# Patient Record
Sex: Male | Born: 1995 | Race: White | Hispanic: No | Marital: Single | Smoking: Never smoker
Health system: Southern US, Community
[De-identification: ages and names within clinical notes are randomized; demographics above are authoritative.]

## PROBLEM LIST (undated history)

## (undated) HISTORY — PX: FOOT SURGERY: SHX648

---
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
## 2020-04-10 ENCOUNTER — Emergency Department: Payer: PRIVATE HEALTH INSURANCE

## 2020-04-10 ENCOUNTER — Emergency Department
Admission: EM | Admit: 2020-04-10 | Discharge: 2020-04-10 | Disposition: A | Discharge status: Home / Self Care | Payer: PRIVATE HEALTH INSURANCE | Attending: Emergency Medicine | Admitting: Emergency Medicine

## 2020-04-10 DIAGNOSIS — Y908 Blood alcohol level of 240 mg/100 ml or more: Secondary | ICD-10-CM

## 2020-04-10 DIAGNOSIS — Z20822 Contact with and (suspected) exposure to covid-19: Secondary | ICD-10-CM | POA: Insufficient documentation

## 2020-04-10 DIAGNOSIS — F10129 Alcohol abuse with intoxication, unspecified: Secondary | ICD-10-CM | POA: Insufficient documentation

## 2020-04-10 DIAGNOSIS — T68XXXA Hypothermia, initial encounter: Secondary | ICD-10-CM

## 2020-04-10 DIAGNOSIS — R680 Hypothermia, not associated with low environmental temperature: Secondary | ICD-10-CM | POA: Insufficient documentation

## 2020-04-10 DIAGNOSIS — F10920 Alcohol use, unspecified with intoxication, uncomplicated: Secondary | ICD-10-CM

## 2020-04-10 LAB — CBC WITH DIFFERENTIAL/PLATELET
Abs Immature Granulocytes: 0.02 10*3/uL (ref 0.00–0.07)
Basophils Absolute: 0 10*3/uL (ref 0.0–0.1)
Basophils Relative: 0 %
Eosinophils Absolute: 0 10*3/uL (ref 0.0–0.5)
Eosinophils Relative: 0 %
HCT: 45.9 % (ref 39.0–52.0)
Hemoglobin: 15.7 g/dL (ref 13.0–17.0)
Immature Granulocytes: 0 %
Lymphocytes Relative: 29 %
Lymphs Abs: 2.3 10*3/uL (ref 0.7–4.0)
MCH: 31.2 pg (ref 26.0–34.0)
MCHC: 34.2 g/dL (ref 30.0–36.0)
MCV: 91.3 fL (ref 80.0–100.0)
Monocytes Absolute: 0.4 10*3/uL (ref 0.1–1.0)
Monocytes Relative: 5 %
Neutro Abs: 5 10*3/uL (ref 1.7–7.7)
Neutrophils Relative %: 66 %
Platelets: 315 10*3/uL (ref 150–400)
RBC: 5.03 MIL/uL (ref 4.22–5.81)
RDW: 12 % (ref 11.5–15.5)
WBC: 7.7 10*3/uL (ref 4.0–10.5)
nRBC: 0 % (ref 0.0–0.2)

## 2020-04-10 LAB — URINE DRUG SCREEN, QUALITATIVE (ARMC ONLY)
Amphetamines, Ur Screen: NOT DETECTED
Barbiturates, Ur Screen: NOT DETECTED
Benzodiazepine, Ur Scrn: NOT DETECTED
Cannabinoid 50 Ng, Ur ~~LOC~~: NOT DETECTED
Cocaine Metabolite,Ur ~~LOC~~: NOT DETECTED
MDMA (Ecstasy)Ur Screen: NOT DETECTED
Methadone Scn, Ur: NOT DETECTED
Opiate, Ur Screen: NOT DETECTED
Phencyclidine (PCP) Ur S: NOT DETECTED
Tricyclic, Ur Screen: NOT DETECTED

## 2020-04-10 LAB — COMPREHENSIVE METABOLIC PANEL
ALT: 20 U/L (ref 0–44)
AST: 29 U/L (ref 15–41)
Albumin: 4.9 g/dL (ref 3.5–5.0)
Alkaline Phosphatase: 57 U/L (ref 38–126)
Anion gap: 14 (ref 5–15)
BUN: 12 mg/dL (ref 6–20)
CO2: 24 mmol/L (ref 22–32)
Calcium: 8.9 mg/dL (ref 8.9–10.3)
Chloride: 103 mmol/L (ref 98–111)
Creatinine, Ser: 1.06 mg/dL (ref 0.61–1.24)
GFR, Estimated: >60 mL/min (ref >60)
Glucose, Bld: 175 mg/dL — ABNORMAL HIGH (ref 70–99)
Potassium: 3.4 mmol/L — ABNORMAL LOW (ref 3.5–5.1)
Sodium: 141 mmol/L (ref 135–145)
Total Bilirubin: 0.7 mg/dL (ref 0.3–1.2)
Total Protein: 8.2 g/dL — ABNORMAL HIGH (ref 6.5–8.1)

## 2020-04-10 LAB — RESP PANEL BY RT-PCR (FLU A&B, COVID) ARPGX2
Influenza A by PCR: NEGATIVE
Influenza B by PCR: NEGATIVE
SARS Coronavirus 2 by RT PCR: NEGATIVE

## 2020-04-10 LAB — ETHANOL: Alcohol, Ethyl (B): 271 mg/dL — ABNORMAL HIGH (ref <10)

## 2020-04-10 LAB — TROPONIN I (HIGH SENSITIVITY): Troponin I (High Sensitivity): 3 ng/L (ref <18)

## 2020-04-10 LAB — CK: Total CK: 202 U/L (ref 49–397)

## 2020-04-10 MED ORDER — DEXTROSE IN LACTATED RINGERS 5 % IV SOLN: INTRAVENOUS | Status: DC

## 2020-04-10 MED ORDER — SODIUM CHLORIDE 0.9 % IV BOLUS
1000.0000 mL | Freq: Once | INTRAVENOUS | Status: AC
  Administered 2020-04-10: 1000 mL via INTRAVENOUS

## 2020-04-10 MED ORDER — ONDANSETRON HCL 4 MG/2ML IJ SOLN
4.0000 mg | Freq: Once | INTRAMUSCULAR | Status: AC
  Administered 2020-04-10: 4 mg via INTRAVENOUS
  Filled 2020-04-10: qty 2

## 2020-04-10 NOTE — ED Notes (Signed)
Friends able to [employer] on story. Pt has been drinking tonight. Unknown amount but found foaming at the mouth by friends.

## 2020-04-10 NOTE — Discharge Instructions (Signed)
Drink alcohol only in moderation.  Return to the ER for worsening symptoms, persistent vomiting, lethargy, difficulty breathing or other concerns.

## 2020-04-10 NOTE — ED Triage Notes (Signed)
Pt to ED unresponsive after friends reportedly found pt face down in the back of a truck. It is unknown if pt ingested anything. Pt is cool and diaphoretic with a clenched jaw upon arrival to treatment room. Emesis on pt. No loss of bowels noted.

## 2020-04-10 NOTE — ED Notes (Signed)
Pt placed on 2L of O2 via [place] by Dr. Dolores Frame. Pt resting in bed at this time. Oral suction needed intermittently due to vomiting but pt able to spit to clear airway. Head of bed elevated.

## 2020-04-10 NOTE — ED Provider Notes (Signed)
I assumed care of this patient approximately 0 700.  Please see outgoing providers note for full details regarding patient's initial evaluation assessment.  In brief patient presented to the ED after being driven by friends found him unresponsive and he was delivered to the ED unresponsive facedown in the back of a truck.  Initial evaluation is concerning for significant EtOH intoxication complicated by hypothermia.  Initial ED work-up: CT Head Unremarkable CXR Unremarkable  CBC unremarkable.  CMP remarkable for K3.4, glucose of 175 and no significant electrolyte or metabolic derangements.  No evidence of acidosis.  Serum ethanol is 271.  CK is 202.  Troponin III.Marland Kitchen UDS negative.  Covid screen negative.  Medications  sodium chloride 0.9 % bolus 1,000 mL (0 mLs Intravenous Stopped 04/10/20 0623)  ondansetron (ZOFRAN) injection 4 mg (4 mg Intravenous Given 04/10/20 0306)  sodium chloride 0.9 % bolus 1,000 mL (0 mLs Intravenous Stopped 04/10/20 0623)  sodium chloride 0.9 % bolus 1,000 mL (1,000 mLs Intravenous New Bag/Given 04/10/20 9323)   Patient initially acquired by hugger for temperature of 93.  Plan is to labs until patient is clinically sober.  After patient reached normothermia of her hyper was move patient subsequently to normothermia without backup.  On several reassessments he had improving mentation and on my last reassessment he was awake alert and not slurring his speech and appeared clinically sober.  He was able to ambulate with steady gait unassisted.  Patient counseled on dangers of binge drinking and alcohol abuse.  Discharged stable condition.  Strict return precautions advised discussed    Gilles Chiquito, MD 04/10/20 [id number]

## 2020-04-10 NOTE — ED Notes (Signed)
X-ray at bedside

## 2020-04-10 NOTE — ED Provider Notes (Signed)
[facility] Emergency Department Provider Note   ____________________________________________   First MD Initiated Contact with Patient 04/10/20 0240     (approximate)  I have reviewed the triage vital signs and the nursing notes.   HISTORY  Chief Complaint Unresponsive state   HPI Darren Roberts is a 24 y.o. male dropped off by friends after being found unresponsive laying face down in the back of the truck.  Heavy EtOH suspected.  Unknown if patient ingested other substances.  Presents to the ED unresponsive, cool to the touch with clenched jaw.  Rest of history currently unobtainable.     Past medical history None  There are no problems to display for this patient.   Past Surgical History:  Procedure Laterality Date  . FOOT SURGERY      Prior to Admission medications   Medication Sig Start Date End Date Taking? Authorizing Provider  HYDROcodone-acetaminophen (NORCO) 5-325 MG per tablet Take 1 tablet by mouth every 6 (six) hours as needed. 01/31/15   Janne Napoleon, NP    Allergies Patient has no known allergies.  No family history on file.  Social History Social History   Tobacco Use  . Smoking status: Never Smoker  Substance Use Topics  . Alcohol use: No  . Drug use: No  +EtOH  Review of Systems  Constitutional: Positive for unresponsive state.  No fever/chills Eyes: No visual changes. ENT: No sore throat. Cardiovascular: Denies chest pain. Respiratory: Denies shortness of breath. Gastrointestinal: No abdominal pain.  Positive for emesis on shirt.  No diarrhea.  No constipation. Genitourinary: Negative for dysuria. Musculoskeletal: Negative for back pain. Skin: Negative for rash. Neurological: Negative for headaches, focal weakness or numbness.   ____________________________________________   PHYSICAL EXAM:  VITAL SIGNS: ED Triage Vitals  Enc Vitals Group     BP      Pulse      Resp      Temp      Temp src       SpO2      Weight      Height      Head Circumference      Peak Flow      Pain Score      Pain Loc      Pain Edu?      Excl. in GC?     Constitutional: Unresponsive, cool, clenched jaw.   Eyes: Conjunctivae are normal. PERRL. EOMI. Head: Atraumatic. Nose: Atraumatic. Mouth/Throat: Mucous membranes are moist.  Clenched jaw.   Neck: No stridor.  No cervical spine step-offs or deformities noted. Cardiovascular: Normal rate, regular rhythm. Grossly normal heart sounds.  Good peripheral circulation. Respiratory: Normal respiratory effort.  No retractions. Lungs CTAB. Gastrointestinal: Soft and nontender to light or deep palpation.  Evidence of emesis on T-shirt.  No distention. No abdominal bruits. No CVA tenderness. Musculoskeletal: No lower extremity edema.  No joint effusions.  No external evidence of injury. Neurologic: Unresponsive.  Opens eyes to sternal rub.  Follows command to open his mouth.  + Gag reflex.   Skin:  Skin is cool, dry and intact. No rash noted. Psychiatric: Unable to assess. ____________________________________________   LABS (all labs ordered are listed, but only abnormal results are displayed)  Labs Reviewed  COMPREHENSIVE METABOLIC PANEL - Abnormal; Notable for the following components:      Result Value   Potassium 3.4 (*)    Glucose, Bld 175 (*)    Total Protein 8.2 (*)  All other components within normal limits  ETHANOL - Abnormal; Notable for the following components:   Alcohol, Ethyl (B) 271 (*)    All other components within normal limits  RESP PANEL BY RT-PCR (FLU A&B, COVID) ARPGX2  CBC WITH DIFFERENTIAL/PLATELET  URINE DRUG SCREEN, QUALITATIVE ([facility] ONLY)  CK  TROPONIN I (HIGH SENSITIVITY)   ____________________________________________  EKG  ED ECG REPORT I, Ruston Fedora J, the attending physician, personally viewed and interpreted this ECG.   Date: 04/10/2020  EKG Time: 0303  Rate: 71  Rhythm: normal EKG, normal sinus rhythm  Axis:  Normal  Intervals:none  ST&T Change: Mild global ST elevation  Repeat EKG; mild ST elevation globally does not appear to be morphologic for STEMI  ED ECG REPORT I, Delfin Squillace J, the attending physician, personally viewed and interpreted this ECG.   Date: 04/10/2020  EKG Time: 0311  Rate: 72  Rhythm: normal EKG, normal sinus rhythm  Axis: Normal  Intervals:none  ST&T Change: Nonspecific   ____________________________________________  RADIOLOGY I, Meric Joye J, personally viewed and evaluated these images (plain radiographs) as part of my medical decision making, as well as reviewing the written report by the radiologist.  ED MD interpretation: No ICH, no acute cardiopulmonary process  Official radiology report(s): CT Head Wo Contrast  Result Date: 04/10/2020 CLINICAL DATA:  Mental status change EXAM: CT HEAD WITHOUT CONTRAST TECHNIQUE: Contiguous axial images were obtained from the base of the skull through the vertex without intravenous contrast. COMPARISON:  None. FINDINGS: Brain: No evidence of acute territorial infarction, hemorrhage, hydrocephalus,extra-axial collection or mass lesion/mass effect. Normal gray-white differentiation. Ventricles are normal in size and contour. Vascular: No hyperdense vessel or unexpected calcification. Skull: The skull is intact. No fracture or focal lesion identified. Sinuses/Orbits: The visualized paranasal sinuses and mastoid air cells are clear. The orbits and globes intact. Other: None IMPRESSION: No acute intracranial abnormality. Electronically Signed   By: Jonna Clark M.D.   On: 04/10/2020 03:50   DG Chest Port 1 View  Result Date: 04/10/2020 CLINICAL DATA:  Unresponsive. EXAM: PORTABLE CHEST 1 VIEW COMPARISON:  None. FINDINGS: The heart size and mediastinal contours are within normal limits. Both lungs are clear. The visualized skeletal structures are unremarkable. There is an old healed right clavicle fracture. IMPRESSION: No active disease.  Electronically Signed   By: Katherine Mantle M.D.   On: 04/10/2020 03:14    ____________________________________________   PROCEDURES  Procedure(s) performed (including Critical Care):  .1-3 Lead EKG Interpretation Performed by: Irean Hong, MD Authorized by: Irean Hong, MD      CRITICAL CARE Performed by: Irean Hong   Total critical care time: 45 minutes  Critical care time was exclusive of separately billable procedures and treating other patients.  Critical care was necessary to treat or prevent imminent or life-threatening deterioration.  Critical care was time spent personally by me on the following activities: development of treatment plan with patient and/or surrogate as well as nursing, discussions with consultants, evaluation of patient's response to treatment, examination of patient, obtaining history from patient or surrogate, ordering and performing treatments and interventions, ordering and review of laboratory studies, ordering and review of radiographic studies, pulse oximetry and re-evaluation of patient's condition.  ____________________________________________   INITIAL IMPRESSION / ASSESSMENT AND PLAN / ED COURSE  As part of my medical decision making, I reviewed the following data within the electronic MEDICAL RECORD NUMBER Nursing notes reviewed and incorporated, Labs reviewed, EKG interpreted, Old chart reviewed (pediatric visits), Radiograph reviewed and Notes  from prior ED visits (clavicle fracture 8833)     24 year old male presenting with unresponsive state after a night of heavy drinking of liquor. Differential diagnosis includes, but is not limited to, alcohol, illicit or prescription medications, or other toxic ingestion; intracranial pathology such as stroke or intracerebral hemorrhage; fever or infectious causes including sepsis; hypoxemia and/or hypercarbia; uremia; trauma; endocrine related disorders such as diabetes, hypoglycemia, and  thyroid-related diseases; hypertensive encephalopathy; etc.  Will obtain toxicological work-up, chest x-ray as patient is at great risk for aspiration.  Obtain CT head as there is unknown history of trauma because history is limited at this time.  Will obtain collateral information from patient's friends.  Initiate IV fluid resuscitation, IV Zofran for emesis.  Will reassess.   Clinical Course as of Apr 10 699  Wynelle Link Apr 10, 2020  5916 Updated patient's father who is at bedside.  States patient is normally not a drinker.  Was with him until 6 PM; suspects he drank a large amount of liquor in a short amount of time.  Patient sleeping soundly, IV fluids infusing.  We will continue to monitor in the emergency department until sobriety.   [JS]  0610 Patient remains asleep under [place] hugger with father at bedside.  Will administer third liter IV fluids   [JS]  0659 Patient sleeping in no acute distress.  Father remains at bedside.  Core temperature now up to 98.2 F.  Anticipate patient may be discharged home once he is sober and ambulatory. Care transferred at change of shift to Dr. Katrinka Blazing pending reassessment.   [JS]    Clinical Course User Index [JS] Irean Hong, MD     ____________________________________________   FINAL CLINICAL IMPRESSION(S) / ED DIAGNOSES  Final diagnoses:  Alcoholic intoxication without complication (HCC)  Hypothermia, initial encounter     ED Discharge Orders    None      *Please note:  Darren Roberts was evaluated in Emergency Department on 04/10/2020 for the symptoms described in the history of present illness. He was evaluated in the context of the global COVID-19 pandemic, which necessitated consideration that the patient might be at risk for infection with the SARS-CoV-2 virus that causes COVID-19. Institutional protocols and algorithms that pertain to the evaluation of patients at risk for COVID-19 are in a state of rapid change based on information released  by regulatory bodies including the CDC and federal and state organizations. These policies and algorithms were followed during the patient's care in the ED.  Some ED evaluations and interventions may be delayed as a result of limited staffing during and the pandemic.*   Note:  This document was prepared using Dragon voice recognition software and may include unintentional dictation errors.   Irean Hong, MD 04/10/20 [phone]

## 2020-04-10 NOTE — ED Notes (Addendum)
This RN and security to truck outside [place] where pt found in back seat, laying face down with emesis on floorboard and on pt. Pt flaccid, unresponsive with no control of motor skills. Pt turned and manually placed into wheelchair and taken to room 7 with MD at bedside.    Per report from friends who dropped pt off, pt consumed large amounts of liquor since 2100 last night and was found passed out in driveway face toward sky with "foam" at mouth. Unknown medical hx. Friends deni illegal substance use.

## 2020-04-10 NOTE — ED Notes (Signed)
Pt is able to stand and walk to the door and back with a steady gait at this time.

## 2021-05-11 IMAGING — DX DG CHEST 1V PORT
1 series · 1 of 1 positions shown · non-contrast
Comparison: None.

CLINICAL DATA: Unresponsive.

EXAM:
PORTABLE CHEST 1 VIEW

[chest ap]
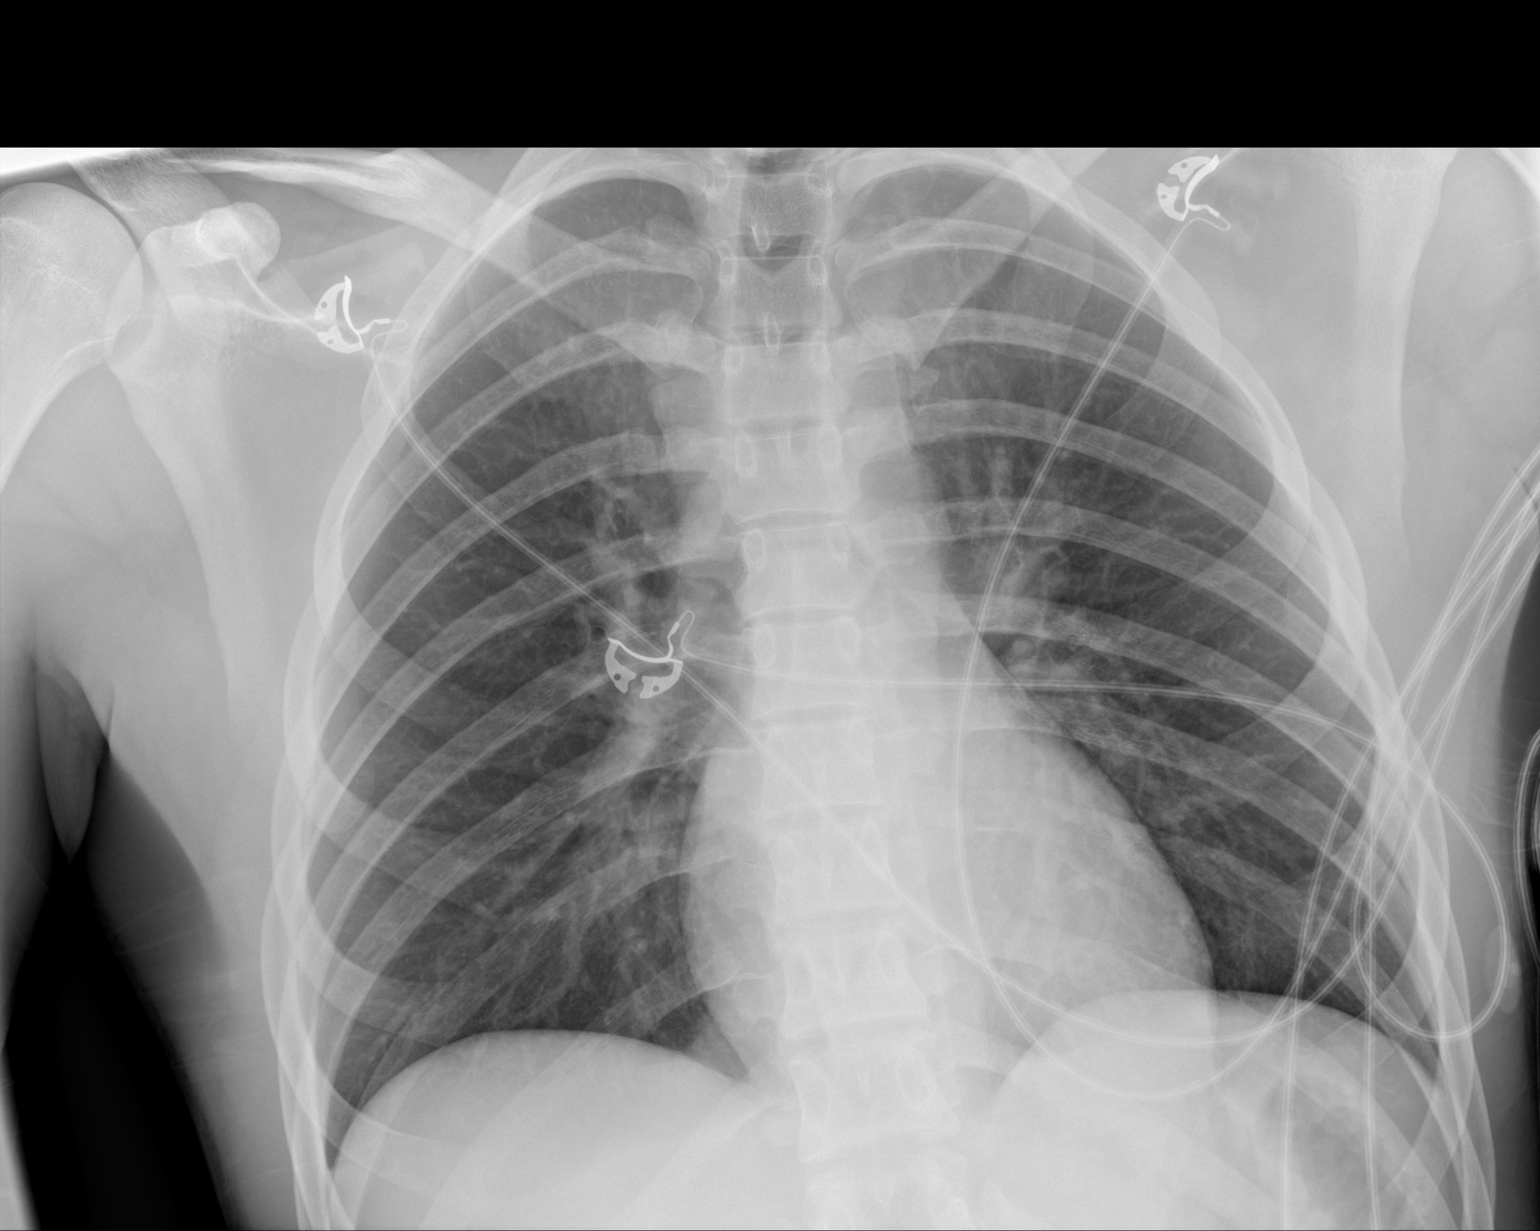

[1 of 1 positions shown; findings below may reference images not displayed]

FINDINGS: The heart size and mediastinal contours are within normal limits.
Both lungs are clear. The visualized skeletal structures are
unremarkable. There is an old healed right clavicle fracture.
IMPRESSION: No active disease.

## 2021-10-31 DIAGNOSIS — S91311A Laceration without foreign body, right foot, initial encounter: Secondary | ICD-10-CM | POA: Diagnosis not present

## 2022-03-28 DIAGNOSIS — D2261 Melanocytic nevi of right upper limb, including shoulder: Secondary | ICD-10-CM | POA: Diagnosis not present

## 2022-03-28 DIAGNOSIS — L304 Erythema intertrigo: Secondary | ICD-10-CM | POA: Diagnosis not present

## 2022-03-28 DIAGNOSIS — D2262 Melanocytic nevi of left upper limb, including shoulder: Secondary | ICD-10-CM | POA: Diagnosis not present

## 2022-03-28 DIAGNOSIS — L568 Other specified acute skin changes due to ultraviolet radiation: Secondary | ICD-10-CM | POA: Diagnosis not present

## 2022-03-28 DIAGNOSIS — D225 Melanocytic nevi of trunk: Secondary | ICD-10-CM | POA: Diagnosis not present

## 2022-03-28 DIAGNOSIS — X32XXXA Exposure to sunlight, initial encounter: Secondary | ICD-10-CM | POA: Diagnosis not present

## 2022-03-28 DIAGNOSIS — Z1283 Encounter for screening for malignant neoplasm of skin: Secondary | ICD-10-CM | POA: Diagnosis not present

## 2022-03-28 DIAGNOSIS — L538 Other specified erythematous conditions: Secondary | ICD-10-CM | POA: Diagnosis not present

## 2022-03-28 DIAGNOSIS — B078 Other viral warts: Secondary | ICD-10-CM | POA: Diagnosis not present

## 2023-04-03 DIAGNOSIS — L0202 Furuncle of face: Secondary | ICD-10-CM | POA: Diagnosis not present

## 2023-04-03 DIAGNOSIS — D2261 Melanocytic nevi of right upper limb, including shoulder: Secondary | ICD-10-CM | POA: Diagnosis not present

## 2023-04-03 DIAGNOSIS — B078 Other viral warts: Secondary | ICD-10-CM | POA: Diagnosis not present

## 2023-04-03 DIAGNOSIS — R238 Other skin changes: Secondary | ICD-10-CM | POA: Diagnosis not present

## 2023-04-03 DIAGNOSIS — D2262 Melanocytic nevi of left upper limb, including shoulder: Secondary | ICD-10-CM | POA: Diagnosis not present

## 2023-04-03 DIAGNOSIS — D225 Melanocytic nevi of trunk: Secondary | ICD-10-CM | POA: Diagnosis not present

## 2023-04-03 DIAGNOSIS — L538 Other specified erythematous conditions: Secondary | ICD-10-CM | POA: Diagnosis not present

## 2023-04-03 DIAGNOSIS — L578 Other skin changes due to chronic exposure to nonionizing radiation: Secondary | ICD-10-CM | POA: Diagnosis not present
